# Patient Record
Sex: Male | Born: 1960 | Race: Black or African American | Hispanic: No | Marital: Married | Smoking: Current every day smoker
Health system: Southern US, Community
[De-identification: ages and names within clinical notes are randomized; demographics above are authoritative.]

## PROBLEM LIST (undated history)

## (undated) ENCOUNTER — Emergency Department: Payer: Self-pay | Source: Home / Self Care

## (undated) DIAGNOSIS — B1920 Unspecified viral hepatitis C without hepatic coma: Secondary | ICD-10-CM

## (undated) HISTORY — PX: FEMORAL TRACTION PIN: SHX1589

## (undated) HISTORY — PX: HERNIA REPAIR: SHX51

---

## 2007-06-14 ENCOUNTER — Emergency Department (HOSPITAL_COMMUNITY): Admission: EM | Admit: 2007-06-14 | Discharge: 2007-06-14 | Payer: Self-pay | Admitting: Emergency Medicine

## 2007-10-01 ENCOUNTER — Emergency Department (HOSPITAL_COMMUNITY): Admission: EM | Admit: 2007-10-01 | Discharge: 2007-10-01 | Payer: Self-pay | Admitting: Emergency Medicine

## 2008-03-24 ENCOUNTER — Encounter (INDEPENDENT_AMBULATORY_CARE_PROVIDER_SITE_OTHER): Payer: Self-pay | Admitting: Emergency Medicine

## 2008-03-24 ENCOUNTER — Ambulatory Visit: Payer: Self-pay | Admitting: Surgery

## 2008-03-24 ENCOUNTER — Emergency Department (HOSPITAL_COMMUNITY): Admission: EM | Admit: 2008-03-24 | Discharge: 2008-03-24 | Payer: Self-pay | Admitting: Emergency Medicine

## 2008-09-05 ENCOUNTER — Emergency Department (HOSPITAL_COMMUNITY): Admission: EM | Admit: 2008-09-05 | Discharge: 2008-09-05 | Payer: Self-pay | Admitting: Emergency Medicine

## 2008-09-14 ENCOUNTER — Ambulatory Visit (HOSPITAL_COMMUNITY): Admission: RE | Admit: 2008-09-14 | Discharge: 2008-09-14 | Payer: Self-pay | Admitting: Chiropractic Medicine

## 2009-05-04 ENCOUNTER — Emergency Department (HOSPITAL_COMMUNITY): Admission: EM | Admit: 2009-05-04 | Discharge: 2009-05-04 | Payer: Self-pay | Admitting: Emergency Medicine

## 2009-06-07 ENCOUNTER — Ambulatory Visit (HOSPITAL_COMMUNITY): Admission: RE | Admit: 2009-06-07 | Discharge: 2009-06-08 | Payer: Self-pay | Admitting: General Surgery

## 2010-12-09 LAB — CBC
HCT: 51.1 % (ref 39.0–52.0)
MCV: 83.5 fL (ref 78.0–100.0)
Platelets: 231 10*3/uL (ref 150–400)
RBC: 6.12 MIL/uL — ABNORMAL HIGH (ref 4.22–5.81)
RDW: 14.9 % (ref 11.5–15.5)

## 2010-12-09 LAB — BASIC METABOLIC PANEL
BUN: 6 mg/dL (ref 6–23)
Calcium: 9.4 mg/dL (ref 8.4–10.5)
Chloride: 103 mEq/L (ref 96–112)
Creatinine, Ser: 0.88 mg/dL (ref 0.4–1.5)
GFR calc Af Amer: >60 mL/min (ref >60)
Glucose, Bld: 90 mg/dL (ref 70–99)
Sodium: 140 mEq/L (ref 135–145)

## 2010-12-09 LAB — DIFFERENTIAL
Eosinophils Absolute: 0.3 10*3/uL (ref 0.0–0.7)
Lymphocytes Relative: 45 % (ref 12–46)
Monocytes Absolute: 0.7 10*3/uL (ref 0.1–1.0)
Monocytes Relative: 10 % (ref 3–12)
Neutrophils Relative %: 41 % — ABNORMAL LOW (ref 43–77)

## 2010-12-10 LAB — BASIC METABOLIC PANEL
BUN: 6 mg/dL (ref 6–23)
GFR calc non Af Amer: >60 mL/min (ref >60)
Glucose, Bld: 110 mg/dL — ABNORMAL HIGH (ref 70–99)
Potassium: 4.6 mEq/L (ref 3.5–5.1)

## 2010-12-10 LAB — URINALYSIS, ROUTINE W REFLEX MICROSCOPIC
Bilirubin Urine: NEGATIVE
Hgb urine dipstick: NEGATIVE
Ketones, ur: 15 mg/dL — AB
Nitrite: NEGATIVE
Protein, ur: NEGATIVE mg/dL
pH: 7 (ref 5.0–8.0)

## 2010-12-10 LAB — CBC
HCT: 51.3 % (ref 39.0–52.0)
Hemoglobin: 17.1 g/dL — ABNORMAL HIGH (ref 13.0–17.0)
RDW: 15 % (ref 11.5–15.5)
WBC: 7.7 10*3/uL (ref 4.0–10.5)

## 2010-12-10 LAB — DIFFERENTIAL
Eosinophils Relative: 0 % (ref 0–5)
Monocytes Absolute: 0.4 10*3/uL (ref 0.1–1.0)
Monocytes Relative: 6 % (ref 3–12)

## 2011-06-02 LAB — DIFFERENTIAL
Lymphs Abs: 2.5
Monocytes Absolute: 0.8
Monocytes Relative: 15 — ABNORMAL HIGH

## 2011-06-02 LAB — PROTIME-INR
INR: 1.1
Prothrombin Time: 14.2

## 2011-06-02 LAB — COMPREHENSIVE METABOLIC PANEL
AST: 40 — ABNORMAL HIGH
Albumin: 3.7
Alkaline Phosphatase: 61
BUN: 7
Calcium: 9.2
Chloride: 105
Creatinine, Ser: 0.88
GFR calc Af Amer: >60
Glucose, Bld: 103 — ABNORMAL HIGH
Total Protein: 6.7

## 2011-06-02 LAB — CBC
HCT: 51
MCV: 83.1
RBC: 6.14 — ABNORMAL HIGH
RDW: 14.1

## 2011-06-02 LAB — D-DIMER, QUANTITATIVE: D-Dimer, Quant: <0.22

## 2011-06-15 LAB — I-STAT 8, (EC8 V) (CONVERTED LAB)
Bicarbonate: 28.4 — ABNORMAL HIGH
Glucose, Bld: 89
Potassium: 4.1
TCO2: 30
pCO2, Ven: 50.5 — ABNORMAL HIGH
pH, Ven: 7.357 — ABNORMAL HIGH

## 2011-06-15 LAB — DIFFERENTIAL
Basophils Absolute: 0.1
Basophils Relative: 1
Eosinophils Absolute: 0.2
Eosinophils Relative: 3
Lymphocytes Relative: 40
Lymphs Abs: 2.4
Monocytes Absolute: 0.9 — ABNORMAL HIGH
Neutro Abs: 2.5
Neutrophils Relative %: 42 — ABNORMAL LOW

## 2011-06-15 LAB — APTT: aPTT: 33

## 2011-06-15 LAB — HEPATIC FUNCTION PANEL
ALT: 55 — ABNORMAL HIGH
Albumin: 3.8
Alkaline Phosphatase: 62
Total Bilirubin: 0.5
Total Protein: 7

## 2011-06-15 LAB — LIPASE, BLOOD: Lipase: 14

## 2011-06-15 LAB — PROTIME-INR
INR: 1
Prothrombin Time: 13.6

## 2011-06-15 LAB — CBC
HCT: 47.7
Platelets: 223

## 2011-08-25 ENCOUNTER — Emergency Department (HOSPITAL_COMMUNITY)
Admission: EM | Admit: 2011-08-25 | Discharge: 2011-08-27 | Discharge status: Against Medical Advice | Payer: Self-pay | Attending: Emergency Medicine | Admitting: Emergency Medicine

## 2011-08-25 ENCOUNTER — Encounter (HOSPITAL_COMMUNITY): Payer: Self-pay | Admitting: *Deleted

## 2011-08-25 ENCOUNTER — Emergency Department (INDEPENDENT_AMBULATORY_CARE_PROVIDER_SITE_OTHER)
Admission: EM | Admit: 2011-08-25 | Discharge: 2011-08-25 | Disposition: A | Discharge status: Nursing Home (Includes ICF) | Payer: Self-pay | Source: Home / Self Care

## 2011-08-25 DIAGNOSIS — B1920 Unspecified viral hepatitis C without hepatic coma: Secondary | ICD-10-CM

## 2011-08-25 DIAGNOSIS — R109 Unspecified abdominal pain: Secondary | ICD-10-CM

## 2011-08-25 DIAGNOSIS — Z0389 Encounter for observation for other suspected diseases and conditions ruled out: Secondary | ICD-10-CM | POA: Insufficient documentation

## 2011-08-25 HISTORY — DX: Unspecified viral hepatitis C without hepatic coma: B19.20

## 2011-08-25 MED ORDER — RABIES VACCINE, PCEC IM SUSR
INTRAMUSCULAR | Status: AC
  Filled 2011-08-25: qty 1

## 2011-08-25 NOTE — ED Notes (Signed)
Was reportedly dx as having hepatitis C in 06-2011, and then went on a drinking binge; c/o he has been feeling bloated, and having all kinds of abdominal area pain; denies recent trauma to abdominal area, reports regular BM's

## 2011-08-25 NOTE — ED Provider Notes (Signed)
History     CSN: [id number]  Arrival date & time 08/25/11  1237   None     Chief Complaint  Patient presents with  . Bloated    (Consider location/radiation/quality/duration/timing/severity/associated sxs/prior treatment) HPI Comments: Pt states he was dx with Hepatitis C Oct 2012 after donating plasma. He did not follow up with a physician. He states after being diagnosed he began drinking heavier. He states he was drinking a 24 pack of beer and 2-3 quarts of wine at least twice a week. He stopped drinking 1 1/2 weeks ago - denies withdrawal symptoms. He c/o abd bloating for 2 1/2 weeks and abd pain and cramping for 2 weeks. Having daily BM and is loose twice a week. This is a change for him as he previously was always constipated. No blood in stool.  Appetite is normal and denies nausea and vomiting.    Past Medical History  Diagnosis Date  . Hepatitis C     History reviewed. No pertinent past surgical history.  History reviewed. No pertinent family history.  History  Substance Use Topics  . Smoking status: Not on file  . Smokeless tobacco: Not on file  . Alcohol Use: Yes     binges      Review of Systems  Constitutional: Negative for fever and chills.  Respiratory: Negative for shortness of breath.   Cardiovascular: Negative for chest pain and leg swelling.  Gastrointestinal: Positive for abdominal pain and abdominal distention. Negative for nausea, vomiting, diarrhea, constipation and blood in stool.  Genitourinary: Positive for frequency. Negative for dysuria.    Allergies  Review of patient's allergies indicates no known allergies.  Home Medications  No current outpatient prescriptions on file.  BP 121/70  Pulse 72  Temp(Src) 98.5 F (36.9 C) (Oral)  Resp 16  SpO2 99%  Physical Exam  Nursing note and vitals reviewed. Constitutional: He appears well-developed and well-nourished. No distress.  HENT:  Head: Normocephalic and atraumatic.  Right Ear:  Tympanic membrane, external ear and ear canal normal.  Left Ear: Tympanic membrane, external ear and ear canal normal.  Nose: Nose normal.  Mouth/Throat: Uvula is midline, oropharynx is clear and moist and mucous membranes are normal. No oropharyngeal exudate, posterior oropharyngeal edema or posterior oropharyngeal erythema.  Neck: Neck supple.  Cardiovascular: Normal rate, regular rhythm and normal heart sounds.   Pulmonary/Chest: Effort normal and breath sounds normal. No respiratory distress.  Abdominal: Soft. Bowel sounds are normal. He exhibits distension. He exhibits no ascites and no mass. There is no hepatosplenomegaly. There is tenderness in the right upper quadrant. There is no rebound, no guarding and negative Murphy's sign.  Lymphadenopathy:    He has no cervical adenopathy.  Neurological: He is alert.  Skin: Skin is warm and dry.  Psychiatric: He has a normal mood and affect.    ED Course  Procedures (including critical care time)  Labs Reviewed - No data to display No results found.   1. Abdominal pain   2. Hepatitis C       MDM  Hep C, alcoholism, abd pain and bloating - transferred to [facility].        Melody Comas, [place] 08/25/11 [phone]

## 2011-08-25 NOTE — ED Notes (Signed)
Called x 1 in triage waiting and main waiting.

## 2011-08-25 NOTE — ED Provider Notes (Signed)
Medical screening examination/treatment/procedure(s) were performed by non-physician practitioner and as supervising physician I was immediately available for consultation/collaboration.  Raynald Blend, MD 08/25/11 [phone]

## 2011-08-25 NOTE — ED Notes (Signed)
To ed for eval of abd pain for the past 2 weeks. Pain is generalized. Hx of hernia repair

## 2011-09-05 ENCOUNTER — Emergency Department (HOSPITAL_COMMUNITY): Payer: Self-pay

## 2011-09-05 ENCOUNTER — Emergency Department (HOSPITAL_COMMUNITY)
Admission: EM | Admit: 2011-09-05 | Discharge: 2011-09-05 | Disposition: A | Discharge status: Home / Self Care | Payer: Self-pay | Attending: Emergency Medicine | Admitting: Emergency Medicine

## 2011-09-05 ENCOUNTER — Encounter (HOSPITAL_COMMUNITY): Payer: Self-pay | Admitting: *Deleted

## 2011-09-05 DIAGNOSIS — R10819 Abdominal tenderness, unspecified site: Secondary | ICD-10-CM | POA: Insufficient documentation

## 2011-09-05 DIAGNOSIS — Z9889 Other specified postprocedural states: Secondary | ICD-10-CM | POA: Insufficient documentation

## 2011-09-05 DIAGNOSIS — Z8619 Personal history of other infectious and parasitic diseases: Secondary | ICD-10-CM | POA: Insufficient documentation

## 2011-09-05 DIAGNOSIS — R109 Unspecified abdominal pain: Secondary | ICD-10-CM | POA: Insufficient documentation

## 2011-09-05 LAB — CBC
Hemoglobin: 14.6 g/dL (ref 13.0–17.0)
MCV: 82.6 fL (ref 78.0–100.0)
Platelets: 177 10*3/uL (ref 150–400)
RBC: 5.47 MIL/uL (ref 4.22–5.81)
WBC: 4.9 10*3/uL (ref 4.0–10.5)

## 2011-09-05 LAB — COMPREHENSIVE METABOLIC PANEL
ALT: 51 U/L (ref 0–53)
Alkaline Phosphatase: 70 U/L (ref 39–117)
CO2: 24 mEq/L (ref 19–32)
Calcium: 8.8 mg/dL (ref 8.4–10.5)
GFR calc Af Amer: >90 mL/min (ref >90)
GFR calc non Af Amer: >90 mL/min (ref >90)
Glucose, Bld: 85 mg/dL (ref 70–99)
Potassium: 4.2 mEq/L (ref 3.5–5.1)
Sodium: 140 mEq/L (ref 135–145)

## 2011-09-05 LAB — URINALYSIS, ROUTINE W REFLEX MICROSCOPIC
Bilirubin Urine: NEGATIVE
Hgb urine dipstick: NEGATIVE
Protein, ur: NEGATIVE mg/dL
Urobilinogen, UA: 1 mg/dL (ref 0.0–1.0)

## 2011-09-05 LAB — DIFFERENTIAL
Eosinophils Relative: 6 % — ABNORMAL HIGH (ref 0–5)
Lymphocytes Relative: 45 % (ref 12–46)
Lymphs Abs: 2.2 10*3/uL (ref 0.7–4.0)
Monocytes Relative: 15 % — ABNORMAL HIGH (ref 3–12)

## 2011-09-05 MED ORDER — IOHEXOL 300 MG/ML  SOLN
100.0000 mL | Freq: Once | INTRAMUSCULAR | Status: AC | PRN
  Administered 2011-09-05: 100 mL via INTRAVENOUS

## 2011-09-05 MED ORDER — SODIUM CHLORIDE 0.9 % IV BOLUS (SEPSIS)
1000.0000 mL | Freq: Once | INTRAVENOUS | Status: AC
  Administered 2011-09-05: 1000 mL via INTRAVENOUS

## 2011-09-05 MED ORDER — HYDROMORPHONE HCL PF 1 MG/ML IJ SOLN
1.0000 mg | Freq: Once | INTRAMUSCULAR | Status: AC
  Administered 2011-09-05: 1 mg via INTRAVENOUS
  Filled 2011-09-05: qty 1

## 2011-09-05 MED ORDER — SODIUM CHLORIDE 0.9 % IV SOLN
INTRAVENOUS | Status: DC
  Administered 2011-09-05: 11:00:00 via INTRAVENOUS

## 2011-09-05 MED ORDER — ONDANSETRON 8 MG PO TBDP: ORAL_TABLET | ORAL | Status: AC

## 2011-09-05 MED ORDER — HYDROCODONE-ACETAMINOPHEN 5-325 MG PO TABS: 2.0000 | ORAL_TABLET | ORAL | Status: AC | PRN

## 2011-09-05 NOTE — ED Provider Notes (Signed)
History     CSN: [id number]  Arrival date & time 09/05/11  1191   First MD Initiated Contact with Patient 09/05/11 [phone]      Chief Complaint  Patient presents with  . Abdominal Pain    (Consider location/radiation/quality/duration/timing/severity/associated sxs/prior treatment) HPI This 51 year old male has about 3 weeks of constant diffuse abdominal pain mostly to the lateral quadrant with intermittent mild nausea but no vomiting no diarrhea no bloody stools no dysuria no scrotal pain or testicular pain no chest pain cough shortness of breath and no exertional pain. His pain is worse with position changes and palpation are better if he stays still. His pain is nonradiating and without associated symptoms or treatment prior to arrival. In the fall of 2012 he was told he had hepatitis C when he was trying to donate plasma. He had a ventral hernia repair in 2010. Past Medical History  Diagnosis Date  . Hepatitis C     Past Surgical History  Procedure Date  . Hernia repair     No family history on file.  History  Substance Use Topics  . Smoking status: Not on file  . Smokeless tobacco: Not on file  . Alcohol Use: Yes     binges      Review of Systems  Constitutional: Negative for fever.       10 Systems reviewed and are negative for acute change except as noted in the HPI.  HENT: Negative for congestion.   Eyes: Negative for discharge and redness.  Respiratory: Negative for cough and shortness of breath.   Cardiovascular: Negative for chest pain.  Gastrointestinal: Positive for nausea and abdominal pain. Negative for vomiting.  Musculoskeletal: Negative for back pain.  Skin: Negative for rash.  Neurological: Negative for syncope, numbness and headaches.  Psychiatric/Behavioral:       No behavior change.    Allergies  Review of patient's allergies indicates no known allergies.  Home Medications   Current Outpatient Rx  Name Route Sig Dispense Refill  .  CITALOPRAM HYDROBROMIDE 20 MG PO TABS Oral Take 20 mg by mouth daily.      . TRAZODONE HCL 100 MG PO TABS Oral Take 50-100 mg by mouth at bedtime.      Marland Kitchen HYDROCODONE-ACETAMINOPHEN 5-325 MG PO TABS Oral Take 2 tablets by mouth every 4 (four) hours as needed for pain. 20 tablet 0  . ONDANSETRON 8 MG PO TBDP  8mg  ODT q4 hours prn nausea 4 tablet 0    BP 134/91  Pulse 64  Temp(Src) 97.8 F (36.6 C) (Oral)  Resp 14  SpO2 99%  Physical Exam  Nursing note and vitals reviewed. Constitutional:       Awake, alert, nontoxic appearance.  HENT:  Head: Atraumatic.  Eyes: Right eye exhibits no discharge. Left eye exhibits no discharge.  Neck: Neck supple.  Cardiovascular: Normal rate and regular rhythm.   No murmur heard. Pulmonary/Chest: Effort normal and breath sounds normal. No respiratory distress. He has no wheezes. He has no rales. He exhibits no tenderness.  Abdominal: Soft. Bowel sounds are normal. He exhibits no mass. There is tenderness. There is guarding. There is no rebound.       Mild diffuse tenderness with moderate tenderness without rebound to the left lower quadrant  Genitourinary:       Testicles are nontender and no palpable inguinal hernias  Musculoskeletal: He exhibits no edema and no tenderness.       Baseline ROM, no obvious new focal weakness.  Neurological: He is alert.       Mental status and motor strength appears baseline for patient and situation.  Skin: No rash noted.  Psychiatric: He has a normal mood and affect.    ED Course  Procedures (including critical care time)  Labs Reviewed  DIFFERENTIAL - Abnormal; Notable for the following:    Neutrophils Relative 33 (*)    Neutro Abs 1.6 (*)    Monocytes Relative 15 (*)    Eosinophils Relative 6 (*)    All other components within normal limits  COMPREHENSIVE METABOLIC PANEL - Abnormal; Notable for the following:    Total Bilirubin 0.2 (*)    All other components within normal limits  CBC  LIPASE, BLOOD    URINALYSIS, ROUTINE W REFLEX MICROSCOPIC   Ct Abdomen Pelvis W Contrast  09/05/2011  *RADIOLOGY REPORT*  Clinical Data: Chronic abdominal pain with increased mid and left lower quadrant pain today.  Nausea.  CT ABDOMEN AND PELVIS WITH CONTRAST  Technique:  Multidetector CT imaging of the abdomen and pelvis was performed following the standard protocol during bolus administration of intravenous contrast.  Contrast: OMNIPAQUE IOHEXOL 300 MG/ML IV SOLN  Comparison: Prior examination 05/04/2009.  Findings: There is mild linear scarring or atelectasis in the lingula.  The lung bases are otherwise clear.  There is no pleural effusion.  There are postsurgical changes within the anterior abdominal wall status post ventral hernia repair.  No recurrent hernia is demonstrated.  The bowel gas pattern is normal.  There is stool throughout the colon.  The appendix appears normal.  No inflammatory changes are demonstrated.  The liver, gallbladder, biliary system and pancreas appear stable. There are stable small splenic granulomas.  The adrenal glands and kidneys appear unremarkable.  There are numerous prominent lymph nodes within the porta hepatis which are similar to the prior study.  Based on stability, these are likely reactive.  There is no pelvic adenopathy.  The bladder, prostate gland and seminal vesicles appear normal.  There is a convex right lumbar scoliosis with associated tortuosity of the abdominal aorta.  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  No acute osseous findings are demonstrated. Spondylosis contributes to some foraminal stenosis, especially on the right at L5-S1.  IMPRESSION:  1.  Interval ventral hernia repair.  No recurrent hernia demonstrated. 2.  No acute abdominal pelvic findings seen. 3.  Stable prominent lymph nodes in the porta hepatis, likely reactive. 4.  Stable lumbar scoliosis and hemivertebra just above the  lumbosacral junction.  Original Report Authenticated By: Gerrianne Scale, M.D.     1. Abdominal pain       MDM  Pt stable in ED with no significant deterioration in condition.Patient / Family / Caregiver informed of clinical course, understand medical decision-making process, and agree with plan.       Hurman Horn, MD 09/05/11 [phone]

## 2011-09-05 NOTE — ED Notes (Signed)
MD at bedside. 

## 2011-09-05 NOTE — ED Notes (Signed)
Pt with chronic abd pain, mid abd pain today, nausea with symptoms, regular bowel movement, no urinary symptoms

## 2011-09-05 NOTE — ED Notes (Signed)
Pt in radiology 

## 2013-10-26 IMAGING — CT CT ABD-PELV W/ CM
1 of 3 series · 14 of 32 positions shown, 19 images · IV contrast (APPLIED)
Comparison: Prior examination 05/04/2009.

CLINICAL DATA: Chronic abdominal pain with increased mid and left
lower quadrant pain today.  Nausea.

CT ABDOMEN AND PELVIS WITH CONTRAST
TECHNIQUE: Multidetector CT imaging of the abdomen and pelvis was
performed following the standard protocol during bolus
administration of intravenous contrast.
Contrast: 100mL OMNIPAQUE IOHEXOL 300 MG/ML IV SOLN

[Series 2: abd/pel with · axial · 0.84mm/px · z∈[+1231,+1631]mm · 14 of 90 slices shown, 19 images]
[im 5/90  soft-tissue]
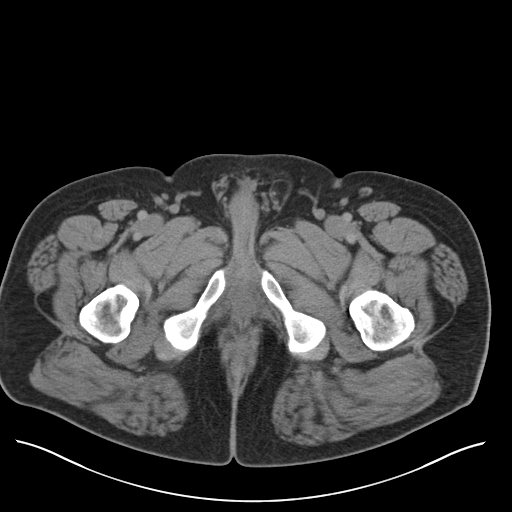
[im 5/90  bone]
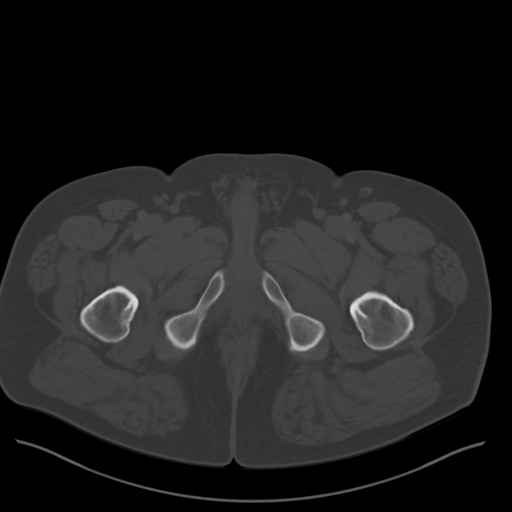
[im 14/90  soft-tissue]
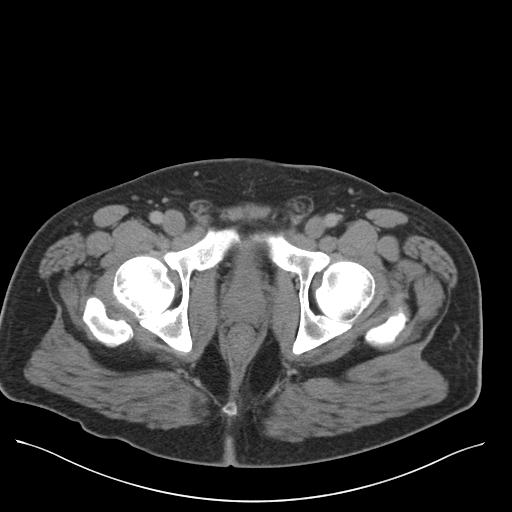
[im 18/90  soft-tissue]
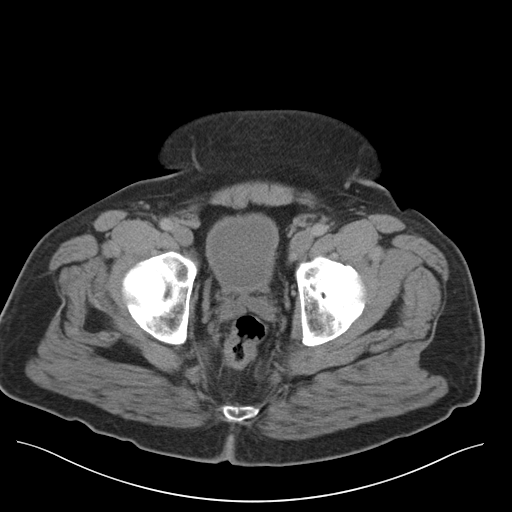
[im 27/90  soft-tissue]
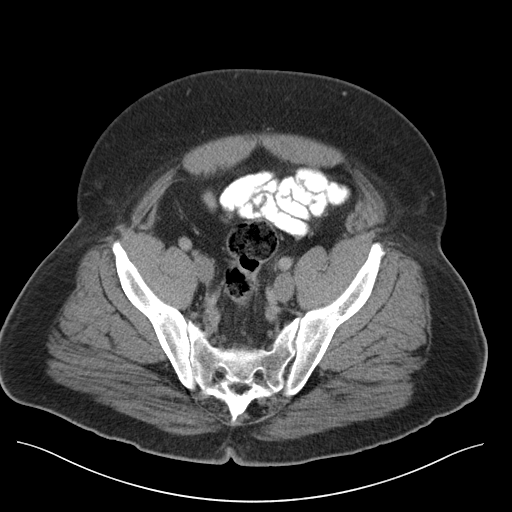
[im 32/90  soft-tissue]
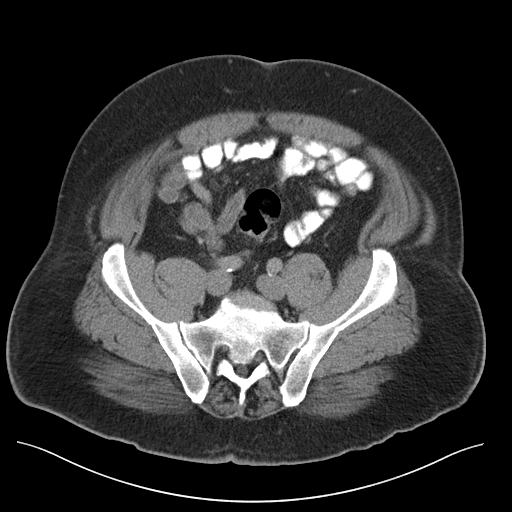
[im 41/90  soft-tissue]
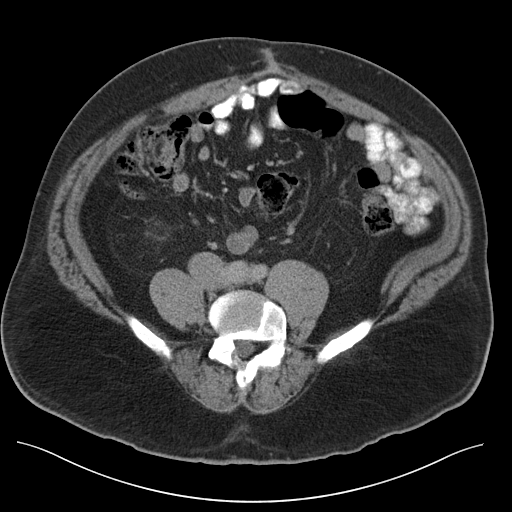
[im 45/90  soft-tissue]
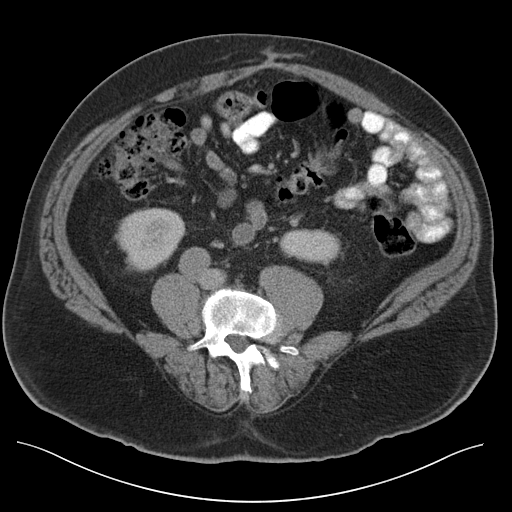
[im 49/90  soft-tissue]
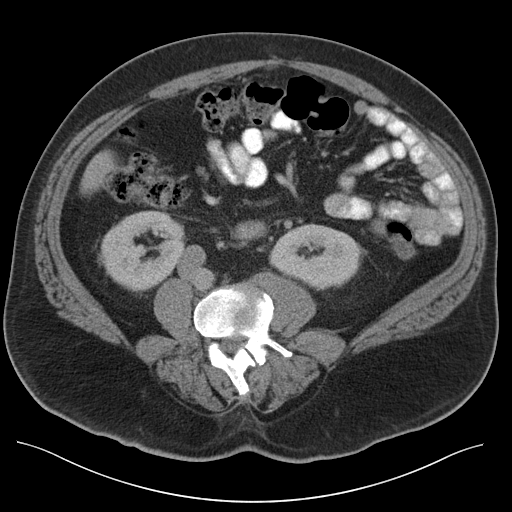
[im 58/90  soft-tissue]
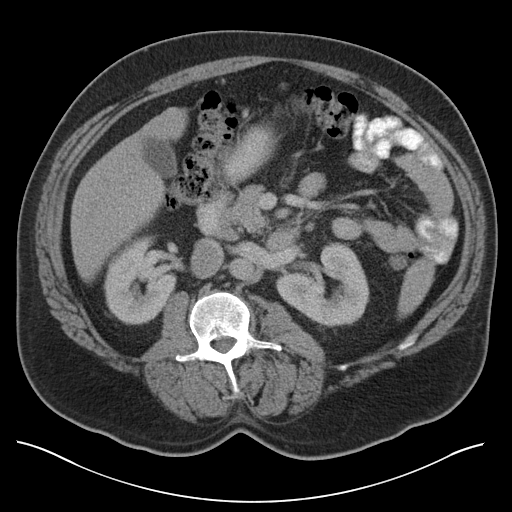
[im 58/90  bone]
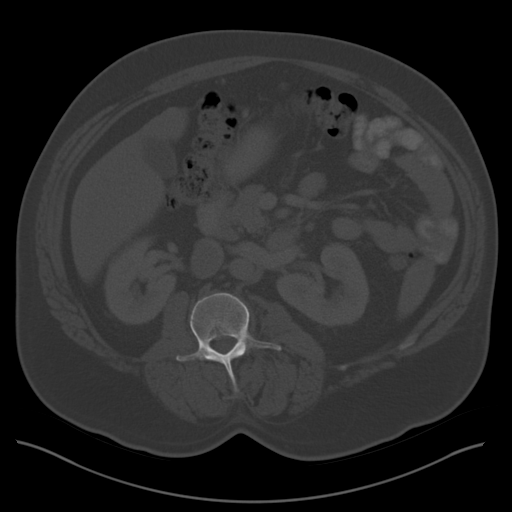
[im 63/90  soft-tissue]
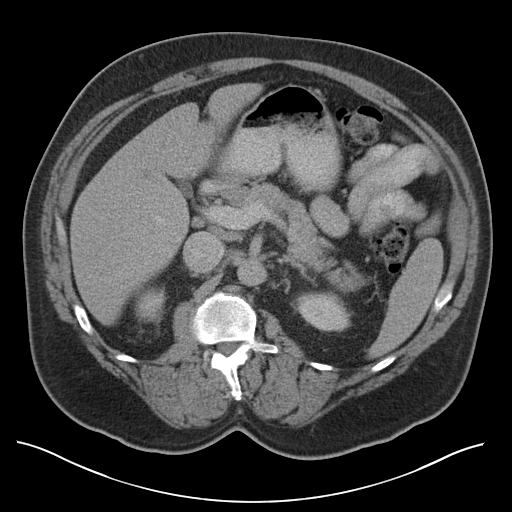
[im 72/90  soft-tissue]
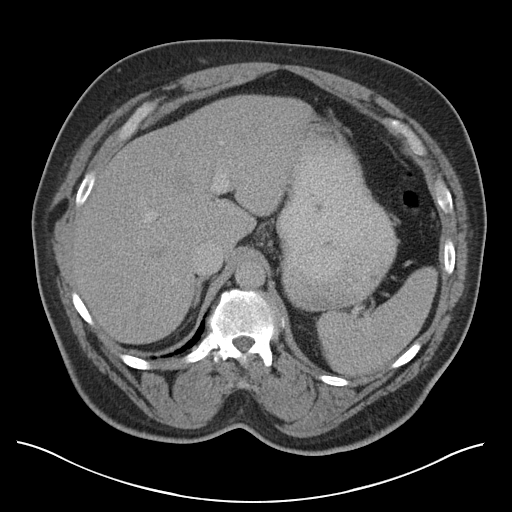
[im 72/90  lung]
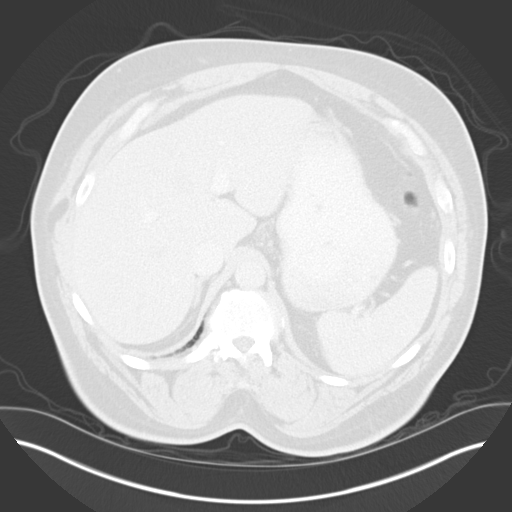
[im 76/90  soft-tissue]
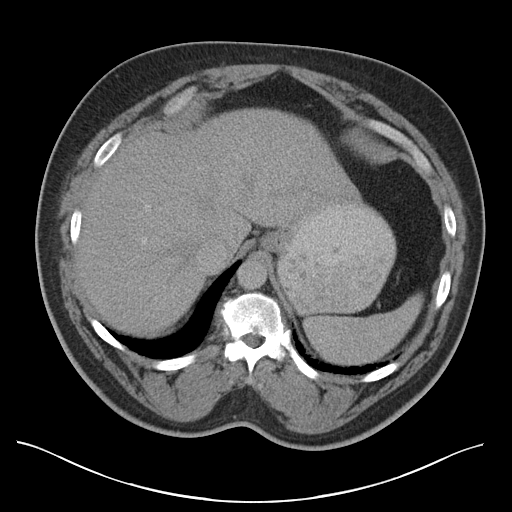
[im 76/90  lung]
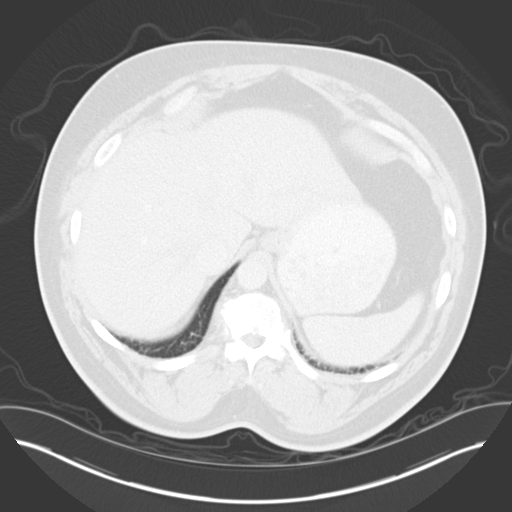
[im 81/90  lung]
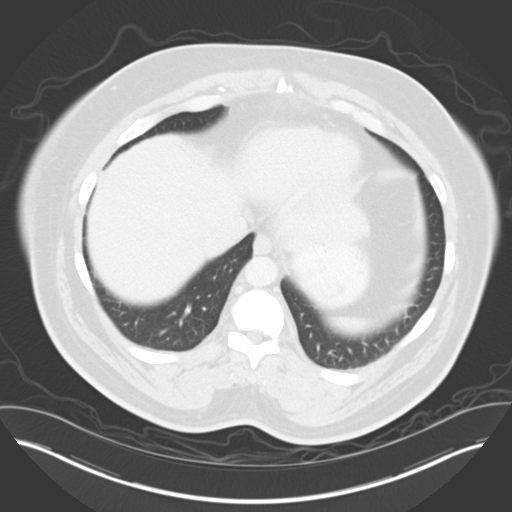
[im 85/90  soft-tissue]
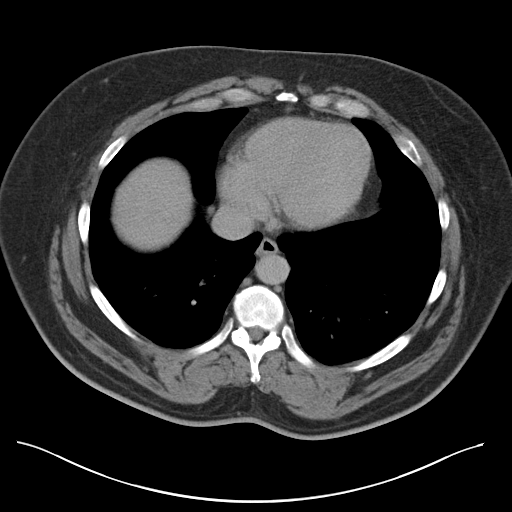
[im 85/90  lung]
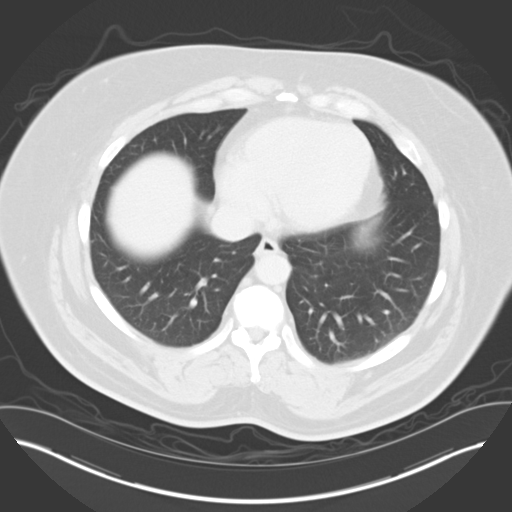

[14 of 32 positions shown; findings below may reference images not displayed]

FINDINGS: There is mild linear scarring or atelectasis in the
lingula.  The lung bases are otherwise clear.  There is no pleural
effusion.

There are postsurgical changes within the anterior abdominal wall
status post ventral hernia repair.  No recurrent hernia is
demonstrated.  The bowel gas pattern is normal.  There is stool
throughout the colon.  The appendix appears normal.  No
inflammatory changes are demonstrated.

The liver, gallbladder, biliary system and pancreas appear stable.
There are stable small splenic granulomas.  The adrenal glands and
kidneys appear unremarkable.

There are numerous prominent lymph nodes within the porta hepatis
which are similar to the prior study.  Based on stability, these
are likely reactive.  There is no pelvic adenopathy.  The bladder,
prostate gland and seminal vesicles appear normal.

There is a convex right lumbar scoliosis with associated tortuosity
of the abdominal aorta.  There is a congenital deformity in the
lower lumbar spine with a hemivertebra just above the lumbosacral
junction.  No acute osseous findings are demonstrated. Spondylosis
contributes to some foraminal stenosis, especially on the right at
L5-S1.
IMPRESSION: 1.  Interval ventral hernia repair.  No recurrent hernia
demonstrated.
2.  No acute abdominal pelvic findings seen.
3.  Stable prominent lymph nodes in the porta hepatis, likely
reactive.
4.  Stable lumbar scoliosis and hemivertebra just above the
lumbosacral junction.

## 2020-01-22 ENCOUNTER — Emergency Department (HOSPITAL_COMMUNITY)
Admission: EM | Admit: 2020-01-22 | Discharge: 2020-01-22 | Disposition: A | Discharge status: Home / Self Care | Payer: Self-pay | Attending: Emergency Medicine | Admitting: Emergency Medicine

## 2020-01-22 ENCOUNTER — Encounter (HOSPITAL_COMMUNITY): Payer: Self-pay | Admitting: *Deleted

## 2020-01-22 DIAGNOSIS — S93401A Sprain of unspecified ligament of right ankle, initial encounter: Secondary | ICD-10-CM | POA: Insufficient documentation

## 2020-01-22 DIAGNOSIS — Y92007 Garden or yard of unspecified non-institutional (private) residence as the place of occurrence of the external cause: Secondary | ICD-10-CM | POA: Insufficient documentation

## 2020-01-22 DIAGNOSIS — Y93H2 Activity, gardening and landscaping: Secondary | ICD-10-CM | POA: Insufficient documentation

## 2020-01-22 DIAGNOSIS — W1842XA Slipping, tripping and stumbling without falling due to stepping into hole or opening, initial encounter: Secondary | ICD-10-CM | POA: Insufficient documentation

## 2020-01-22 DIAGNOSIS — Y999 Unspecified external cause status: Secondary | ICD-10-CM | POA: Insufficient documentation

## 2020-01-22 NOTE — ED Provider Notes (Signed)
[facility] EMERGENCY DEPARTMENT Provider Note   CSN: [id number] Arrival date & time: 01/22/20  0347     History Chief Complaint  Patient presents with  . work note    Ryan Carlson is a 59 y.o. male who presents for a work note. He states "I don't even need to be here". He states that he was cutting grass a couple days ago and stepped in a hole with his R foot and twisted his ankle. It became swollen and so he rested and iced it. 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 He states he has been walking normally. He denies any pain or swelling.   HPI     Past Medical History:  Diagnosis Date  . Hepatitis C     There are no problems to display for this patient.   Past Surgical History:  Procedure Laterality Date  . HERNIA REPAIR         No family history on file.  Social History   Tobacco Use  . Smoking status: Not on file  Substance Use Topics  . Alcohol use: Yes    Comment: binges  . Drug use: Not on file    Home Medications Prior to Admission medications   Medication Sig Start Date End Date Taking? Authorizing Provider  citalopram (CELEXA) 20 MG tablet Take 20 mg by mouth daily.      [provider]  traZODone (DESYREL) 100 MG tablet Take 50-100 mg by mouth at bedtime.      [provider]    Allergies    Patient has no known allergies.  Review of Systems   Review of Systems  Musculoskeletal: Positive for arthralgias (resolved) and joint swelling (resolved).  Neurological: Negative for weakness and numbness.    Physical Exam Updated Vital Signs BP (!) 158/89 (BP Location: Right Arm)   Pulse 84   Temp 97.9 F (36.6 C) (Oral)   Resp 16   SpO2 98%   Physical Exam Vitals and nursing note reviewed.  Constitutional:      General: He is not in acute distress.  Appearance: He is well-developed. He is obese. He is not ill-appearing.  HENT:     Head: Normocephalic and atraumatic.  Eyes:     General: No scleral icterus.       Right eye: No discharge.        Left eye: No discharge.     Conjunctiva/sclera: Conjunctivae normal.     Pupils: Pupils are equal, round, and reactive to light.  Cardiovascular:     Rate and Rhythm: Normal rate.  Pulmonary:     Effort: Pulmonary effort is normal. No respiratory distress.  Abdominal:     General: There is no distension.  Musculoskeletal:     Cervical back: Normal range of motion.     Comments: Right ankle: No obvious swelling, deformity, or warmth. No tenderness. FROM. 5/5 strength. Cap refill <2. N/V intact.   Skin:    General: Skin is warm and dry.  Neurological:     Mental Status: He is alert and oriented to person, place, and time.  Psychiatric:        Behavior: Behavior normal.     ED Results / Procedures / Treatments   Labs (all labs ordered are listed, but only abnormal results  are displayed) Labs Reviewed - No data to display  EKG None  Radiology No results found.  Procedures Procedures (including critical care time)  Medications Ordered in ED Medications - No data to display  ED Course  I have reviewed the triage vital signs and the nursing notes.  Pertinent labs & imaging results that were available during my care of the patient were reviewed by me and considered in my medical decision making (see chart for details).  59 year old male presents for a work note after twisting his ankle a couple days ago.  Patient denies any symptoms and states that all the pain and swelling in his ankle is gone.  Imaging deferred today. Work note given.  MDM Rules/Calculators/A&P                      Final Clinical Impression(s) / ED Diagnoses Final diagnoses:  Sprain of right ankle, unspecified ligament, initial encounter    Rx / DC Orders ED Discharge Orders    None       Recardo Evangelist, PA-C 01/22/20 [place]    Charlesetta Shanks, MD 01/31/20 [phone]

## 2020-01-22 NOTE — ED Notes (Signed)
Patient Alert and oriented to baseline. Stable and ambulatory to baseline. Patient verbalized understanding of the discharge instructions.  Patient belongings were taken by the patient.   

## 2020-01-22 NOTE — ED Notes (Signed)
Pt moving arm during first BP documented. Rechecked

## 2020-01-22 NOTE — ED Triage Notes (Signed)
Sent to ED for a work note. States last week he was cutting his own grass (not at work) and stepped in a hole. Had ankle pain that evening - relieved with ice and no pain since. Pts employer wants him to have a work note stating his ankle is ok. Pt has zero complaints.

## 2023-08-07 ENCOUNTER — Other Ambulatory Visit: Payer: Self-pay | Admitting: Nurse Practitioner

## 2023-08-07 DIAGNOSIS — B182 Chronic viral hepatitis C: Secondary | ICD-10-CM

## 2023-08-07 DIAGNOSIS — K7400 Hepatic fibrosis, unspecified: Secondary | ICD-10-CM

## 2023-08-14 ENCOUNTER — Ambulatory Visit
Admission: RE | Admit: 2023-08-14 | Discharge: 2023-08-14 | Disposition: A | Discharge status: Home / Self Care | Payer: Self-pay | Source: Ambulatory Visit | Attending: Nurse Practitioner | Admitting: Nurse Practitioner

## 2023-08-14 DIAGNOSIS — B182 Chronic viral hepatitis C: Secondary | ICD-10-CM

## 2023-08-14 DIAGNOSIS — K7400 Hepatic fibrosis, unspecified: Secondary | ICD-10-CM

## 2023-08-31 ENCOUNTER — Ambulatory Visit: Payer: No Typology Code available for payment source | Admitting: Orthopaedic Surgery

## 2023-09-07 ENCOUNTER — Other Ambulatory Visit (INDEPENDENT_AMBULATORY_CARE_PROVIDER_SITE_OTHER): Payer: Self-pay

## 2023-09-07 ENCOUNTER — Encounter: Payer: Self-pay | Admitting: Physician Assistant

## 2023-09-07 ENCOUNTER — Ambulatory Visit (INDEPENDENT_AMBULATORY_CARE_PROVIDER_SITE_OTHER): Payer: No Typology Code available for payment source | Admitting: Physician Assistant

## 2023-09-07 ENCOUNTER — Other Ambulatory Visit: Payer: Self-pay

## 2023-09-07 DIAGNOSIS — M170 Bilateral primary osteoarthritis of knee: Secondary | ICD-10-CM | POA: Diagnosis not present

## 2023-09-07 MED ORDER — BUPIVACAINE HCL 0.25 % IJ SOLN
0.6600 mL | INTRAMUSCULAR | Status: AC | PRN
  Administered 2023-09-07: .66 mL via INTRA_ARTICULAR

## 2023-09-07 MED ORDER — LIDOCAINE HCL 1 % IJ SOLN
3.0000 mL | INTRAMUSCULAR | Status: AC | PRN
  Administered 2023-09-07: 3 mL

## 2023-09-07 MED ORDER — METHYLPREDNISOLONE ACETATE 40 MG/ML IJ SUSP
13.3300 mg | INTRAMUSCULAR | Status: AC | PRN
  Administered 2023-09-07: 13.33 mg via INTRA_ARTICULAR

## 2023-09-07 NOTE — Progress Notes (Signed)
   Office Visit Note   Patient: Ryan Carlson           Date of Birth: 27-Feb-1961           MRN: [id number] Visit Date: 09/07/2023              Requested by: Jhon Elveria LABOR, MD [address] [place],  [place] [address] PCP: Jhon Elveria LABOR, MD   Assessment & Plan: Visit Diagnoses:  1. Bilateral primary osteoarthritis of knee     Plan: Impression is bilateral knee osteoarthritis.  Today we discussed various treatment options to include intra-articular cortisone injection for which she would like to proceed.  He will follow-up as needed.  Follow-Up Instructions: Return if symptoms worsen or fail to improve.   Orders:  Orders Placed This Encounter  Procedures   Large Joint Inj: bilateral knee   XR KNEE 3 VIEW LEFT   XR KNEE 3 VIEW RIGHT   No orders of the defined types were placed in this encounter.     Procedures: Large Joint Inj: bilateral knee on 09/07/2023 9:07 AM Indications: pain Details: 22 G needle, anterolateral approach Medications (Right): 0.66 mL bupivacaine  0.25 %; 3 mL lidocaine  1 %; 13.33 mg methylPREDNISolone  acetate 40 MG/ML Medications (Left): 0.66 mL bupivacaine  0.25 %; 3 mL lidocaine  1 %; 13.33 mg methylPREDNISolone  acetate 40 MG/ML      Clinical Data: No additional findings.   Subjective: Chief Complaint  Patient presents with   Right Knee - Pain   Left Knee - Pain    HPI patient is a pleasant 63 year old gentleman who comes in today with bilateral knee pain both equally as bad.  Symptoms have been ongoing for the past year and a half.  Pain is constant but worse in the morning when he gets out of the bed.  He has a fair amount of stiffness at this time.  He has occasional swelling.  He has been using a knee brace which does seem to provide some support.  He does not take any medication for pain.  Review of Systems as detailed in HPI.  All others reviewed and are negative.   Objective: Vital Signs: There were no vitals  taken for this visit.  Physical Exam well-developed well-nourished gentleman in no acute distress.  Alert and oriented x 3.  Ortho Exam bilateral knee exam: Range of motion 0 to 115 degrees.  Medial joint line tenderness.  Mild patellofemoral crepitus.  He is neurovascular intact distally.  Specialty Comments:  No specialty comments available.  Imaging: XR KNEE 3 VIEW LEFT Result Date: 09/07/2023 Advanced degenerative changes medial and patellofemoral compartments  XR KNEE 3 VIEW RIGHT Result Date: 09/07/2023 X-rays demonstrate advanced degenerative changes to the medial and patellofemoral compartments    PMFS History: There are no active problems to display for this patient.  Past Medical History:  Diagnosis Date   Hepatitis C     No family history on file.  Past Surgical History:  Procedure Laterality Date   HERNIA REPAIR     Social History   Occupational History   Not on file  Tobacco Use   Smoking status: Not on file   Smokeless tobacco: Not on file  Substance and Sexual Activity   Alcohol use: Yes    Comment: binges   Drug use: Not on file   Sexual activity: Not on file

## 2023-10-30 LAB — COLOGUARD: COLOGUARD: NEGATIVE

## 2024-02-28 ENCOUNTER — Emergency Department (HOSPITAL_COMMUNITY)
Admission: EM | Admit: 2024-02-28 | Discharge: 2024-02-28 | Disposition: A | Discharge status: Home / Self Care | Attending: Emergency Medicine | Admitting: Emergency Medicine

## 2024-02-28 ENCOUNTER — Encounter (HOSPITAL_COMMUNITY): Payer: Self-pay

## 2024-02-28 ENCOUNTER — Other Ambulatory Visit: Payer: Self-pay

## 2024-02-28 DIAGNOSIS — R799 Abnormal finding of blood chemistry, unspecified: Secondary | ICD-10-CM | POA: Diagnosis present

## 2024-02-28 DIAGNOSIS — C9110 Chronic lymphocytic leukemia of B-cell type not having achieved remission: Secondary | ICD-10-CM | POA: Diagnosis not present

## 2024-02-28 LAB — CBC WITH DIFFERENTIAL/PLATELET
Abs Immature Granulocytes: 0 10*3/uL (ref 0.00–0.07)
Basophils Absolute: 0 10*3/uL (ref 0.0–0.1)
Basophils Relative: 0 %
Eosinophils Absolute: 0 10*3/uL (ref 0.0–0.5)
Eosinophils Relative: 0 %
HCT: 51.1 % (ref 39.0–52.0)
Hemoglobin: 16.5 g/dL (ref 13.0–17.0)
Lymphocytes Relative: 86 %
Lymphs Abs: 53.1 10*3/uL — ABNORMAL HIGH (ref 0.7–4.0)
MCH: 27.4 pg (ref 26.0–34.0)
MCHC: 32.3 g/dL (ref 30.0–36.0)
MCV: 84.9 fL (ref 80.0–100.0)
Monocytes Absolute: 1.2 10*3/uL — ABNORMAL HIGH (ref 0.1–1.0)
Monocytes Relative: 2 %
Neutro Abs: 7.4 10*3/uL (ref 1.7–7.7)
Neutrophils Relative %: 12 %
Platelets: 161 10*3/uL (ref 150–400)
RBC: 6.02 MIL/uL — ABNORMAL HIGH (ref 4.22–5.81)
RDW: 20.3 % — ABNORMAL HIGH (ref 11.5–15.5)
WBC: 61.7 10*3/uL (ref 4.0–10.5)
nRBC: 0 /100{WBCs}
nRBC: 0.7 % — ABNORMAL HIGH (ref 0.0–0.2)

## 2024-02-28 LAB — COMPREHENSIVE METABOLIC PANEL WITH GFR
ALT: 16 U/L (ref 0–44)
AST: 32 U/L (ref 15–41)
Albumin: 3.6 g/dL (ref 3.5–5.0)
Alkaline Phosphatase: 59 U/L (ref 38–126)
Anion gap: 7 (ref 5–15)
BUN: 12 mg/dL (ref 8–23)
CO2: 25 mmol/L (ref 22–32)
Calcium: 9 mg/dL (ref 8.9–10.3)
Chloride: 104 mmol/L (ref 98–111)
Creatinine, Ser: 0.98 mg/dL (ref 0.61–1.24)
GFR, Estimated: >60 mL/min (ref >60)
Glucose, Bld: 122 mg/dL — ABNORMAL HIGH (ref 70–99)
Potassium: 5 mmol/L (ref 3.5–5.1)
Sodium: 136 mmol/L (ref 135–145)
Total Bilirubin: 0.5 mg/dL (ref 0.0–1.2)
Total Protein: 7.3 g/dL (ref 6.5–8.1)

## 2024-02-28 LAB — I-STAT CG4 LACTIC ACID, ED: Lactic Acid, Venous: 1.2 mmol/L (ref 0.5–1.9)

## 2024-02-28 NOTE — ED Provider Notes (Signed)
 [place] EMERGENCY DEPARTMENT AT [facility] Provider Note   CSN: [id number] Arrival date & time: 02/28/24  1003     Patient presents with: abnormal labs   Ryan Carlson is a 63 y.o. male.   HPI 63 year old male presents with an elevated WBC.  He has a history of hepatitis C and was getting labs done yesterday in preparation for a visit today.  He was called this morning and told that his white blood cell count was in the 60s and to come to the ER for evaluation.  He denies fevers, weight loss, night sweats.  No vomiting, cough, or any other acute symptoms.  Prior to Admission medications   Not on File    Allergies: Patient has no known allergies.    Review of Systems  Constitutional:  Negative for diaphoresis, fever and unexpected weight change.  Respiratory:  Negative for cough.   Gastrointestinal:  Negative for abdominal pain and vomiting.    Updated Vital Signs BP (!) 160/99 (BP Location: Right Arm)   Pulse 99   Temp 98 F (36.7 C)   Resp 20   Ht 5' 7 (1.702 m)   Wt 127 kg   SpO2 92%   BMI 43.85 kg/m   Physical Exam Vitals and nursing note reviewed.  Constitutional:      Appearance: He is well-developed. He is obese.  HENT:     Head: Normocephalic and atraumatic.   Cardiovascular:     Rate and Rhythm: Normal rate and regular rhythm.     Heart sounds: Normal heart sounds.  Pulmonary:     Effort: Pulmonary effort is normal.     Breath sounds: Normal breath sounds.  Abdominal:     Palpations: Abdomen is soft.     Tenderness: There is no abdominal tenderness.   Skin:    General: Skin is warm and dry.   Neurological:     Mental Status: He is alert.     (all labs ordered are listed, but only abnormal results are displayed) Labs Reviewed  COMPREHENSIVE METABOLIC PANEL WITH GFR - Abnormal; Notable for the following components:      Result Value   Glucose, Bld 122 (*)    All other components within normal limits  CBC WITH  DIFFERENTIAL/PLATELET - Abnormal; Notable for the following components:   WBC 61.7 (*)    RBC 6.02 (*)    RDW 20.3 (*)    nRBC 0.7 (*)    Lymphs Abs 53.1 (*)    Monocytes Absolute 1.2 (*)    All other components within normal limits  URINALYSIS, W/ REFLEX TO CULTURE (INFECTION SUSPECTED)  PATHOLOGIST SMEAR REVIEW  FLOW CYTOMETRY  LACTATE DEHYDROGENASE  I-STAT CG4 LACTIC ACID, ED    EKG: None  Radiology: No results found.   Procedures   Medications Ordered in the ED - No data to display                                  Medical Decision Making Amount and/or Complexity of Data Reviewed External Data Reviewed: labs and notes. Labs: ordered.    Details: WBC 62 with predominantly lymphocytes   Patient is well-appearing.  I discussed with Dr. 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  He asks for flow cytometry and LDH to be ordered but will follow these up as outpatient.  Otherwise, patient has no red flags on history or  exam.  Will discharge for outpatient workup and follow-up.     Final diagnoses:  CLL (chronic lymphocytic leukemia) [facility])    ED Discharge Orders          Ordered    Ambulatory referral to Hematology / Oncology       Comments: Your emergency department provider has referred you to see a hematology/oncology specialist. These are physicians who specialize in blood disorders and cancers, or findings concerning for cancer. You will receive a phone call from the [facility] to set up your appointment within 2 business days: [employer] operate Mon - Fri, 8:00 a.m. to 5:00 p.m.; closed for federally recognized holidays. Please be sure your phone is not set to block numbers during this time.   02/28/24 1334               Freddi Hamilton, MD 02/28/24 1339

## 2024-02-28 NOTE — ED Triage Notes (Signed)
 Pt states he was at the liver doctor and did blood work and states WBC was 62. Hx of hepatitis. Denies fevers/n/v/d.

## 2024-02-28 NOTE — Discharge Instructions (Signed)
 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  You are being referred to a hematology/oncology specialist for further workup and treatment.  If you develop fever, pain, or any other new/concerning symptoms then return to the ER for evaluation.

## 2024-02-29 LAB — PATHOLOGIST SMEAR REVIEW

## 2024-03-03 NOTE — Progress Notes (Signed)
 [facility] [phone]   [phone]  INITIAL CONSULT NOTE  Patient Care Team: Jhon Elveria LABOR, MD as PCP - General (Family Medicine)  CHIEF COMPLAINTS/PURPOSE OF CONSULTATION:  Lymphocytosis  HISTORY OF PRESENTING ILLNESS:  Ryan Carlson 63 y.o. male with medical history significant for hepatitis C s/p treatment with Epclusa completed in March 2025 who presents to the hematology clinic for evaluation of lymphocytosis. He is unaccompanied for this visit.   On review of the previous records, Ryan Carlson presents to his hepatology clinic on 02/27/2024 for follow up for management of chronic hepatitis C. Labs showed leukocytosis/lymphocytosis with WBC 62.9 and absolute lymphocyte count of 56.5. He was directed to the emergency room for further evaluation. Peripheral smear review was done on 02/28/2024 that showed lymphocytosis with smudge cells highly suspicious for chronic lymphocytic leukemia.   On exam today, Ryan Carlson reports his energy and appetite are unchanged.He  can complete all his ADLs on his own. He denies any weight loss. He denies nausea, vomiting or bowel habit changes. He does have some fullness in the L side of his abdomen with mild discomfort. He denies easy bruising or signs of bleeding. He denies fevers, chills,sweat,shortness of breath, chest pain, cough, headaches, dizziness or palpable lumps/bumps. He has no other complaints.   MEDICAL HISTORY:  Past Medical History:  Diagnosis Date   Hepatitis C     SURGICAL HISTORY: Past Surgical History:  Procedure Laterality Date   FEMORAL TRACTION PIN     HERNIA REPAIR      SOCIAL HISTORY: Social History   Socioeconomic History   Marital status: Married    Spouse name: Not on file   Number of children: Not on file   Years of education: Not on file   Highest education level: Not on file  Occupational History   Not on file  Tobacco Use   Smoking status: Every Day    Current  packs/day: 1.00    Types: Cigarettes   Smokeless tobacco: Not on file  Substance and Sexual Activity   Alcohol use: Yes    Alcohol/week: 3.0 standard drinks of alcohol    Types: 3 Cans of beer per week   Drug use: Never   Sexual activity: Not on file  Other Topics Concern   Not on file  Social History Narrative   Not on file   Social Drivers of Health   Financial Resource Strain: Not on file  Food Insecurity: Low Risk  (03/06/2024)   Received from [facility]   Hunger Vital Sign    Within the past 12 months, you worried that your food would run out before you got money to buy more: Never true    Within the past 12 months, the food you bought just didn't last and you didn't have money to get more. : Never true  Recent Concern: Food Insecurity - Food Insecurity Present (03/04/2024)   Hunger Vital Sign    Worried About Running Out of Food in the Last Year: Sometimes true    Ran Out of Food in the Last Year: Sometimes true  Transportation Needs: No Transportation Needs (03/06/2024)   Received from [employer]    In the past 12 months, has lack of reliable transportation kept you from medical appointments, meetings, work or from getting things needed for daily living? : No  Physical Activity: Not on file  Stress: Not on file  Social Connections: Not on file  Intimate Partner Violence: Not  At Risk (03/04/2024)   Humiliation, Afraid, Rape, and Kick questionnaire    Fear of Current or Ex-Partner: No    Emotionally Abused: No    Physically Abused: No    Sexually Abused: No    FAMILY HISTORY: History reviewed. No pertinent family history.  ALLERGIES:  has no known allergies.  MEDICATIONS:  Current Outpatient Medications  Medication Sig Dispense Refill   losartan (COZAAR) 25 MG tablet Take 25 mg by mouth daily.     No current facility-administered medications for this visit.    REVIEW OF SYSTEMS:   Constitutional: ( - ) fevers, ( - )  chills , ( - ) night  sweats Eyes: ( - ) blurriness of vision, ( - ) double vision, ( - ) watery eyes Ears, nose, mouth, throat, and face: ( - ) mucositis, ( - ) sore throat Respiratory: ( - ) cough, ( - ) dyspnea, ( - ) wheezes Cardiovascular: ( - ) palpitation, ( - ) chest discomfort, ( - ) lower extremity swelling Gastrointestinal:  ( - ) nausea, ( - ) heartburn, ( - ) change in bowel habits Skin: ( - ) abnormal skin rashes Lymphatics: ( - ) new lymphadenopathy, ( - ) easy bruising Neurological: ( - ) numbness, ( - ) tingling, ( - ) new weaknesses Behavioral/Psych: ( - ) mood change, ( - ) new changes  All other systems were reviewed with the patient and are negative.  PHYSICAL EXAMINATION: ECOG PERFORMANCE STATUS: 0 - Asymptomatic  Vitals:   03/04/24 0822 03/04/24 0823  BP: (!) 145/83 130/74  Pulse: 76   Resp: 17   Temp: 97.6 F (36.4 C)   SpO2: 93%    Filed Weights   03/04/24 0822  Weight: 280 lb 12.8 oz (127.4 kg)    GENERAL: well appearing male in NAD  SKIN: skin color, texture, turgor are normal, no rashes or significant lesions EYES: conjunctiva are pink and non-injected, sclera clear OROPHARYNX: no exudate, no erythema; lips, buccal mucosa, and tongue normal  NECK: supple, non-tender LYMPH:  no palpable lymphadenopathy in the cervical, axillary or supraclavicular lymph nodes.  LUNGS: clear to auscultation and percussion with normal breathing effort HEART: regular rate & rhythm and no murmurs and no lower extremity edema ABDOMEN: soft, non-tender, normal bowel sounds. Fullness in left side of abdomen with palpable splenomegaly.  Musculoskeletal: no cyanosis of digits and no clubbing  PSYCH: alert & oriented x 3, fluent speech NEURO: no focal motor/sensory deficits  LABORATORY DATA:  I have reviewed the data as listed    Latest Ref Rng & Units 03/04/2024    9:39 AM 02/28/2024   10:18 AM 09/05/2011   10:11 AM  CBC  WBC 4.0 - 10.5 K/uL 48.3  61.7  4.9   Hemoglobin 13.0 - 17.0 g/dL 83.5   83.4  85.3   Hematocrit 39.0 - 52.0 % 50.0  51.1  45.2   Platelets 150 - 400 K/uL 167  161  177        Latest Ref Rng & Units 03/04/2024    9:39 AM 02/28/2024   10:18 AM 09/05/2011   10:11 AM  CMP  Glucose 70 - 99 mg/dL 95  877  85   BUN 8 - 23 mg/dL 11  12  14    Creatinine 0.61 - 1.24 mg/dL 9.21  9.01  9.19   Sodium 135 - 145 mmol/L 137  136  140   Potassium 3.5 - 5.1 mmol/L 4.3  5.0  4.2  Chloride 98 - 111 mmol/L 106  104  107   CO2 22 - 32 mmol/L 26  25  24    Calcium 8.9 - 10.3 mg/dL 9.5  9.0  8.8   Total Protein 6.5 - 8.1 g/dL 7.8  7.3  7.1   Total Bilirubin 0.0 - 1.2 mg/dL 0.4  0.5  0.2   Alkaline Phos 38 - 126 U/L 64  59  70   AST 15 - 41 U/L 13  32  37   ALT 0 - 44 U/L 10  16  51     RADIOGRAPHIC STUDIES: I have personally reviewed the radiological images as listed and agreed with the findings in the report. No results found.  ASSESSMENT & PLAN Ryan Carlson is a 63 y.o. male who presents to the clinic for evaluation of lymphocytosis concerning from chronic lymphocytic leukemia.   Lymphocytosis is an elevation in the lymphocyte cells in the blood. This may indicate several possible conditions including hematological malignancies, transient elevation from infection, or response to inflammation. Lymphocytes may also be elevated after splenectomy. The workup for lymphocytosis consists of determining if the lymphocytes are malignant in nature. The best test to determine this is flow cytometry, which can help determine if the lymphocytes are a monoclonal population.   #Lymphocytosis --labs to include CBC, CMP, LDH, uric acid and flow cytometry --due to strong suspicion of CLL, we will proceed with obtaining prognostic labs including IgVH hypermutation status, ZAP-70 and CLL prognostic panel.  --no evidence of lymphadenopathy on physical exam. Patient has no history of splenectomy but evidence of splenomegaly noted on abdominal US  from 08/2023.  --no B-symptoms identified --If  there is confirmation of CLL per workup today, tentative plan is observation as patient has Rai Stage II.  --RTC in 3 months with repeat labs.    Orders Placed This Encounter  Procedures   CBC with Differential (Cancer Center Only)    Standing Status:   Future    Number of Occurrences:   1    Expiration Date:   03/03/2025   CMP (Cancer Center only)    Standing Status:   Future    Number of Occurrences:   1    Expiration Date:   03/03/2025   Lactate dehydrogenase (LDH)    Standing Status:   Future    Number of Occurrences:   1    Expiration Date:   03/03/2025   Flow Cytometry, Peripheral Blood (Oncology)    Standing Status:   Future    Number of Occurrences:   1    Expiration Date:   03/03/2025   FISH, CLL Prognostic Panel    Standing Status:   Future    Number of Occurrences:   1    Expiration Date:   03/03/2025   IgVH Somatic Hypermutation    Standing Status:   Future    Number of Occurrences:   1    Expiration Date:   03/03/2025   Save Smear for Provider Slide Review    Standing Status:   Future    Number of Occurrences:   1    Expiration Date:   03/04/2025   Uric acid    Standing Status:   Future    Number of Occurrences:   1    Expiration Date:   03/04/2025   Miscellaneous [facility] test (send-out)    Standing Status:   Future    Number of Occurrences:   1    Expiration Date:   03/04/2025  Test name / description::   Zap-70, test # [id number]    All questions were answered. The patient knows to call the clinic with any problems, questions or concerns.  I have spent a total of 60 minutes minutes of face-to-face and non-face-to-face time, preparing to see the patient, obtaining and/or reviewing separately obtained history, performing a medically appropriate examination, counseling and educating the patient, ordering medications/tests/procedures, referring and communicating with other health care professionals, documenting clinical information in the electronic health record,  independently interpreting results and communicating results to the patient, and care coordination.   Johnston Police, PA-C Department of Hematology/Oncology [facility] at [facility] Phone: [phone]  Patient was seen with Dr. Federico.   Shermon CHRISTELLA Hearing BD, Catovsky D, Caligaris-Cappio F, Dighiero G, Dhner H, Hillmen P, Keating M, Montserrat E, Chiorazzi N, Stilgenbauer S, Rai KR, [place], Eichhorst B, O'Brien S, Robak T, Seymour JF, Kipps TJ. iwCLL guidelines for diagnosis, indications for treatment, response assessment, and supportive management of CLL. Blood. 2018 Jun 21;131(25):2745-2760.  Active disease should be clearly documented to initiate therapy. At least 1 of the following criteria should be met.  1) Evidence of progressive marrow failure as manifested by the development of, or worsening of, anemia and/or thrombocytopenia. Cutoff levels of Hb <10 g/dL or platelet counts <899  109/L are generally regarded as indication for treatment. However, in some patients, platelet counts <100  109/L may remain stable over a long period; this situation does not automatically require therapeutic intervention. 2) Massive (ie, >=6 cm below the left costal margin) or progressive or symptomatic splenomegaly. 3) Massive nodes (ie, >=10 cm in longest diameter) or progressive or symptomatic lymphadenopathy. 4) Progressive lymphocytosis with an increase of >=50% over a 75-month period, or lymphocyte doubling time (LDT) <6 months. LDT can be obtained by linear regression extrapolation of absolute lymphocyte counts obtained at intervals of 2 weeks over an observation period of 2 to 3 months; patients with initial blood lymphocyte counts <30  109/L may require a longer observation period to determine the LDT. Factors contributing to lymphocytosis other than CLL (eg, infections, steroid administration) should be excluded. 5) Autoimmune complications including anemia or thrombocytopenia  poorly responsive to corticosteroids. 6) Symptomatic or functional extranodal involvement (eg, skin, kidney, lung, spine). Disease-related symptoms as defined by any of the following: Unintentional weight loss >=10% within the previous 6 months. Significant fatigue (ie, ECOG performance scale 2 or worse; cannot work or unable to perform usual activities). Fevers >=100.41F or 38.0C for 2 or more weeks without evidence of infection. Night sweats for >=1 month without evidence of infection.   I have read the above note and personally examined the patient. I agree with the assessment and plan as noted above.  Briefly Ryan Carlson is a 63 year old male who presents for evaluation of marked lymphocytosis.  He underwent routine blood work recently which revealed this increase in lymphocytes.  He was told to go to the emergency department at which time it is recommended he follow-up with us  in the outpatient setting.  He reports that he has had no recent infectious symptoms such as fevers, chills, sweats.  He has had no trouble with nausea, vomiting, or diarrhea.  Overall he feels well and is his baseline level of health.  His other blood counts including hemoglobin and platelets are within normal limits.  At this time findings do appear consistent with a CLL like condition.  Will order flow cytometry in order to evaluate this further.  In event the flow cytometry is inconclusive could recommend further studies to include CT chest abdomen pelvis.  If this were to be CLL patient does not require immediate treatment as his other blood counts are stable he does not currently meet criteria for treatment.  If he were to have a rapid progression of his lymphocytes we could consider initiation of therapy.  Briefly discussed with him treatment options including immunotherapy infusions such as Gazyva with venetoclax or medications like ibrutinib.  He voices understanding of our findings and plan moving forward.   Norleen IVAR Kidney, MD Department of Hematology/Oncology [facility] at [facility] Phone: [phone] Pager: [phone] Email: [email or web address]

## 2024-03-04 ENCOUNTER — Inpatient Hospital Stay

## 2024-03-04 ENCOUNTER — Encounter: Payer: Self-pay | Admitting: Physician Assistant

## 2024-03-04 ENCOUNTER — Inpatient Hospital Stay: Attending: Physician Assistant | Admitting: Physician Assistant

## 2024-03-04 VITALS — BP 130/74 | HR 76 | Temp 97.6°F | Resp 17 | Ht 67.0 in | Wt 280.8 lb

## 2024-03-04 DIAGNOSIS — F17210 Nicotine dependence, cigarettes, uncomplicated: Secondary | ICD-10-CM | POA: Diagnosis not present

## 2024-03-04 DIAGNOSIS — B182 Chronic viral hepatitis C: Secondary | ICD-10-CM | POA: Insufficient documentation

## 2024-03-04 DIAGNOSIS — D72820 Lymphocytosis (symptomatic): Secondary | ICD-10-CM | POA: Insufficient documentation

## 2024-03-04 LAB — CBC WITH DIFFERENTIAL (CANCER CENTER ONLY)
Abs Immature Granulocytes: 0.11 10*3/uL — ABNORMAL HIGH (ref 0.00–0.07)
Basophils Absolute: 0 10*3/uL (ref 0.0–0.1)
Basophils Relative: 0 %
Eosinophils Absolute: 0.2 10*3/uL (ref 0.0–0.5)
Eosinophils Relative: 1 %
HCT: 50 % (ref 39.0–52.0)
Hemoglobin: 16.4 g/dL (ref 13.0–17.0)
Immature Granulocytes: 0 %
Lymphocytes Relative: 86 %
Lymphs Abs: 41.6 10*3/uL — ABNORMAL HIGH (ref 0.7–4.0)
MCH: 27.2 pg (ref 26.0–34.0)
MCHC: 32.8 g/dL (ref 30.0–36.0)
MCV: 82.8 fL (ref 80.0–100.0)
Monocytes Absolute: 2.3 10*3/uL — ABNORMAL HIGH (ref 0.1–1.0)
Monocytes Relative: 5 %
Neutro Abs: 4.1 10*3/uL (ref 1.7–7.7)
Neutrophils Relative %: 8 %
Platelet Count: 167 10*3/uL (ref 150–400)
RBC: 6.04 MIL/uL — ABNORMAL HIGH (ref 4.22–5.81)
RDW: 19.4 % — ABNORMAL HIGH (ref 11.5–15.5)
Smear Review: NORMAL
WBC Count: 48.3 10*3/uL — ABNORMAL HIGH (ref 4.0–10.5)
nRBC: 0 % (ref 0.0–0.2)

## 2024-03-04 LAB — CMP (CANCER CENTER ONLY)
ALT: 10 U/L (ref 0–44)
AST: 13 U/L — ABNORMAL LOW (ref 15–41)
Albumin: 4.1 g/dL (ref 3.5–5.0)
Alkaline Phosphatase: 64 U/L (ref 38–126)
Anion gap: 5 (ref 5–15)
BUN: 11 mg/dL (ref 8–23)
CO2: 26 mmol/L (ref 22–32)
Calcium: 9.5 mg/dL (ref 8.9–10.3)
Chloride: 106 mmol/L (ref 98–111)
Creatinine: 0.78 mg/dL (ref 0.61–1.24)
GFR, Estimated: >60 mL/min (ref >60)
Glucose, Bld: 95 mg/dL (ref 70–99)
Potassium: 4.3 mmol/L (ref 3.5–5.1)
Sodium: 137 mmol/L (ref 135–145)
Total Bilirubin: 0.4 mg/dL (ref 0.0–1.2)
Total Protein: 7.8 g/dL (ref 6.5–8.1)

## 2024-03-04 LAB — URIC ACID: Uric Acid, Serum: 6.7 mg/dL (ref 3.7–8.6)

## 2024-03-04 LAB — LACTATE DEHYDROGENASE: LDH: 148 U/L (ref 98–192)

## 2024-03-04 LAB — SAVE SMEAR(SSMR), FOR PROVIDER SLIDE REVIEW

## 2024-03-05 ENCOUNTER — Telehealth: Payer: Self-pay | Admitting: Physician Assistant

## 2024-03-05 NOTE — Telephone Encounter (Signed)
Scheduled appointments with the patient

## 2024-03-12 LAB — SURGICAL PATHOLOGY

## 2024-03-13 ENCOUNTER — Telehealth: Payer: Self-pay | Admitting: Physician Assistant

## 2024-03-13 LAB — FLOW CYTOMETRY

## 2024-03-13 NOTE — Telephone Encounter (Signed)
 I called and spoke to Mr. Ryan Carlson to review lab results from 03/04/2024. 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 Patient does not meet criteria to initiate treatment so he will proceed with observation at this time. He will return in 3 months for a follow up visit with repeat labs. Mr. Flinn expressed understanding of the plan provided.

## 2024-03-14 LAB — IGVH SOMATIC HYPERMUTATION

## 2024-03-20 LAB — MISC LABCORP TEST (SEND OUT): Labcorp test code: 252489

## 2024-05-27 ENCOUNTER — Telehealth: Payer: Self-pay | Admitting: Hematology and Oncology

## 2024-06-04 ENCOUNTER — Ambulatory Visit: Admitting: Physician Assistant

## 2024-06-04 ENCOUNTER — Other Ambulatory Visit

## 2024-06-19 ENCOUNTER — Inpatient Hospital Stay: Attending: Physician Assistant

## 2024-06-19 ENCOUNTER — Other Ambulatory Visit: Payer: Self-pay | Admitting: Hematology and Oncology

## 2024-06-19 ENCOUNTER — Inpatient Hospital Stay: Admitting: Hematology and Oncology

## 2024-06-19 VITALS — BP 132/82 | HR 88 | Temp 97.4°F | Resp 15 | Wt 277.4 lb

## 2024-06-19 DIAGNOSIS — C9110 Chronic lymphocytic leukemia of B-cell type not having achieved remission: Secondary | ICD-10-CM

## 2024-06-19 DIAGNOSIS — Z79899 Other long term (current) drug therapy: Secondary | ICD-10-CM | POA: Diagnosis not present

## 2024-06-19 DIAGNOSIS — D72820 Lymphocytosis (symptomatic): Secondary | ICD-10-CM

## 2024-06-19 DIAGNOSIS — F17210 Nicotine dependence, cigarettes, uncomplicated: Secondary | ICD-10-CM | POA: Diagnosis not present

## 2024-06-19 LAB — CBC WITH DIFFERENTIAL (CANCER CENTER ONLY)
Abs Immature Granulocytes: 0.24 K/uL — ABNORMAL HIGH (ref 0.00–0.07)
Basophils Absolute: 0 K/uL (ref 0.0–0.1)
Basophils Relative: 0 %
Eosinophils Absolute: 0.2 K/uL (ref 0.0–0.5)
Eosinophils Relative: 0 %
HCT: 51.3 % (ref 39.0–52.0)
Hemoglobin: 16.6 g/dL (ref 13.0–17.0)
Immature Granulocytes: 0 %
Lymphocytes Relative: 91 %
Lymphs Abs: 68.1 K/uL — ABNORMAL HIGH (ref 0.7–4.0)
MCH: 27.1 pg (ref 26.0–34.0)
MCHC: 32.4 g/dL (ref 30.0–36.0)
MCV: 83.8 fL (ref 80.0–100.0)
Monocytes Absolute: 2.1 K/uL — ABNORMAL HIGH (ref 0.1–1.0)
Monocytes Relative: 3 %
Neutro Abs: 4.5 K/uL (ref 1.7–7.7)
Neutrophils Relative %: 6 %
Platelet Count: 160 K/uL (ref 150–400)
RBC: 6.12 MIL/uL — ABNORMAL HIGH (ref 4.22–5.81)
RDW: 19 % — ABNORMAL HIGH (ref 11.5–15.5)
Smear Review: NORMAL
WBC Count: 75.1 K/uL (ref 4.0–10.5)
nRBC: 0 % (ref 0.0–0.2)

## 2024-06-19 LAB — CMP (CANCER CENTER ONLY)
ALT: 16 U/L (ref 0–44)
AST: 22 U/L (ref 15–41)
Albumin: 4.1 g/dL (ref 3.5–5.0)
Alkaline Phosphatase: 76 U/L (ref 38–126)
Anion gap: 3 — ABNORMAL LOW (ref 5–15)
BUN: 13 mg/dL (ref 8–23)
CO2: 29 mmol/L (ref 22–32)
Calcium: 10.5 mg/dL — ABNORMAL HIGH (ref 8.9–10.3)
Chloride: 105 mmol/L (ref 98–111)
Creatinine: 0.94 mg/dL (ref 0.61–1.24)
GFR, Estimated: >60 mL/min (ref >60)
Glucose, Bld: 88 mg/dL (ref 70–99)
Potassium: 4.5 mmol/L (ref 3.5–5.1)
Sodium: 137 mmol/L (ref 135–145)
Total Bilirubin: 0.4 mg/dL (ref 0.0–1.2)
Total Protein: 7.8 g/dL (ref 6.5–8.1)

## 2024-06-19 LAB — LACTATE DEHYDROGENASE: LDH: 152 U/L (ref 98–192)

## 2024-06-19 NOTE — Progress Notes (Signed)
 [facility] [phone]   [phone]  PROGRESS NOTE  Patient Care Team: Jhon Elveria LABOR, MD as PCP - General (Family Medicine)  Hematological/Oncological History # CLL Rai Stage 0   Interval History:  Ryan Carlson 63 y.o. male with medical history significant for newly diagnosed CLL who presents for a follow up visit. The patient's last visit was on 03/04/2024 at which time he establish care. In the interim since the last visit he has had no major changes in his health.  On exam today Ryan Carlson reports he has been about the same in the interim since our last visit.  He reports that he does have some occasional aches and pains but nothing major.  He reports he does not have any issues with runny nose, sore throat, cough and denies any fevers, chills, sweats.  He reports his appetite remains great and he is eating well.  His weight is stable.  He denies any bumps or lumps concerning for lymphadenopathy.  He reports overall his energy is good but he does have some occasional bad days.  He notes he remains quite active.  He is had no trouble with bleeding, bruising, or dark stools.  He reports he is interested in getting his flu shot.  He notes nothing else out of the ordinary.  A full 10 point ROS is otherwise negative.  MEDICAL HISTORY:  Past Medical History:  Diagnosis Date   Hepatitis C     SURGICAL HISTORY: Past Surgical History:  Procedure Laterality Date   FEMORAL TRACTION PIN     HERNIA REPAIR      SOCIAL HISTORY: Social History   Socioeconomic History   Marital status: Married    Spouse name: Not on file   Number of children: Not on file   Years of education: Not on file   Highest education level: Not on file  Occupational History   Not on file  Tobacco Use   Smoking status: Every Day    Current packs/day: 1.00    Types: Cigarettes   Smokeless tobacco: Not on file  Substance and Sexual Activity   Alcohol use: Yes     Alcohol/week: 3.0 standard drinks of alcohol    Types: 3 Cans of beer per week   Drug use: Never   Sexual activity: Not on file  Other Topics Concern   Not on file  Social History Narrative   Not on file   Social Drivers of Health   Financial Resource Strain: Not on file  Food Insecurity: Low Risk  (03/06/2024)   Received from [facility]   Hunger Vital Sign    Within the past 12 months, you worried that your food would run out before you got money to buy more: Never true    Within the past 12 months, the food you bought just didn't last and you didn't have money to get more. : Never true  Recent Concern: Food Insecurity - Food Insecurity Present (03/04/2024)   Hunger Vital Sign    Worried About Running Out of Food in the Last Year: Sometimes true    Ran Out of Food in the Last Year: Sometimes true  Transportation Needs: No Transportation Needs (03/06/2024)   Received from [facility]    In the past 12 months, has lack of reliable transportation kept you from medical appointments, meetings, work or from getting things needed for daily living? : No  Physical Activity: Not on file  Stress: Not  on file  Social Connections: Not on file  Intimate Partner Violence: Not At Risk (03/04/2024)   Humiliation, Afraid, Rape, and Kick questionnaire    Fear of Current or Ex-Partner: No    Emotionally Abused: No    Physically Abused: No    Sexually Abused: No    FAMILY HISTORY: No family history on file.  ALLERGIES:  has no known allergies.  MEDICATIONS:  Current Outpatient Medications  Medication Sig Dispense Refill   losartan (COZAAR) 25 MG tablet Take 25 mg by mouth daily.     No current facility-administered medications for this visit.    REVIEW OF SYSTEMS:   Constitutional: ( - ) fevers, ( - )  chills , ( - ) night sweats Eyes: ( - ) blurriness of vision, ( - ) double vision, ( - ) watery eyes Ears, nose, mouth, throat, and face: ( - ) mucositis, ( - ) sore  throat Respiratory: ( - ) cough, ( - ) dyspnea, ( - ) wheezes Cardiovascular: ( - ) palpitation, ( - ) chest discomfort, ( - ) lower extremity swelling Gastrointestinal:  ( - ) nausea, ( - ) heartburn, ( - ) change in bowel habits Skin: ( - ) abnormal skin rashes Lymphatics: ( - ) new lymphadenopathy, ( - ) easy bruising Neurological: ( - ) numbness, ( - ) tingling, ( - ) new weaknesses Behavioral/Psych: ( - ) mood change, ( - ) new changes  All other systems were reviewed with the patient and are negative.  PHYSICAL EXAMINATION: Vitals:   06/19/24 1447  BP: 132/82  Pulse: 88  Resp: 15  Temp: (!) 97.4 F (36.3 C)  SpO2: 91%   Filed Weights   06/19/24 1447  Weight: 277 lb 6.4 oz (125.8 kg)    GENERAL: Well-appearing middle-age male, alert, no distress and comfortable SKIN: skin color, texture, turgor are normal, no rashes or significant lesions EYES: conjunctiva are pink and non-injected, sclera clear LUNGS: clear to auscultation and percussion with normal breathing effort HEART: regular rate & rhythm and no murmurs and no lower extremity edema Musculoskeletal: no cyanosis of digits and no clubbing  PSYCH: alert & oriented x 3, fluent speech NEURO: no focal motor/sensory deficits  LABORATORY DATA:  I have reviewed the data as listed    Latest Ref Rng & Units 06/19/2024    2:20 PM 03/04/2024    9:39 AM 02/28/2024   10:18 AM  CBC  WBC 4.0 - 10.5 K/uL 75.1  48.3  61.7   Hemoglobin 13.0 - 17.0 g/dL 83.3  83.5  83.4   Hematocrit 39.0 - 52.0 % 51.3  50.0  51.1   Platelets 150 - 400 K/uL 160  167  161        Latest Ref Rng & Units 06/19/2024    2:20 PM 03/04/2024    9:39 AM 02/28/2024   10:18 AM  CMP  Glucose 70 - 99 mg/dL 88  95  877   BUN 8 - 23 mg/dL 13  11  12    Creatinine 0.61 - 1.24 mg/dL 9.05  9.21  9.01   Sodium 135 - 145 mmol/L 137  137  136   Potassium 3.5 - 5.1 mmol/L 4.5  4.3  5.0   Chloride 98 - 111 mmol/L 105  106  104   CO2 22 - 32 mmol/L 29  26  25     Calcium 8.9 - 10.3 mg/dL 89.4  9.5  9.0   Total Protein 6.5 - 8.1  g/dL 7.8  7.8  7.3   Total Bilirubin 0.0 - 1.2 mg/dL 0.4  0.4  0.5   Alkaline Phos 38 - 126 U/L 76  64  59   AST 15 - 41 U/L 22  13  32   ALT 0 - 44 U/L 16  10  16      RADIOGRAPHIC STUDIES: No results found.  ASSESSMENT & PLAN Ryan Carlson 63 y.o. male with medical history significant for newly diagnosed CLL who presents for a follow up visit.   # CLL Rai Stage 0 -- Labs today show 75.1, hemoglobin 16.6, MCV 83.8, platelets 160.  Creatinine LFTs within normal limits. -- Does not appear that the FISH prognostic panel was drawn, will assure this is collected at next visit. -- No evidence of lymphadenopathy, splenomegaly, or cytopenias. -- No clear indication to start treatment based on today's assessment. -- Recommend repeat labs in 3 months time with clinic visit in 6 months.  No orders of the defined types were placed in this encounter.   All questions were answered. The patient knows to call the clinic with any problems, questions or concerns.  A total of more than 30 minutes were spent on this encounter with face-to-face time and non-face-to-face time, including preparing to see the patient, ordering tests and/or medications, counseling the patient and coordination of care as outlined above.   Norleen IVAR Kidney, MD Department of Hematology/Oncology [facility] Phone: [phone] Pager: [phone] Email: [email or web address]  06/29/2024 7:17 PM  Shermon CHRISTELLA Hearing BD, Catovsky D, Caligaris-Cappio F, Dighiero G, Dhner H, Hillmen P, Keating M, Montserrat E, Chiorazzi N, Stilgenbauer S, Rai KR, Raiford, Eichhorst B, O'Brien S, Robak T, Seymour JF, Kipps TJ. iwCLL guidelines for diagnosis, indications for treatment, response assessment, and supportive management of CLL. Blood. 2018 Jun 21;131(25):2745-2760.  Active disease should be clearly documented to initiate  therapy. At least 1 of the following criteria should be met.  1) Evidence of progressive marrow failure as manifested by the development of, or worsening of, anemia and/or thrombocytopenia. Cutoff levels of Hb <10 g/dL or platelet counts <899  109/L are generally regarded as indication for treatment. However, in some patients, platelet counts <100  109/L may remain stable over a long period; this situation does not automatically require therapeutic intervention. 2) Massive (ie, >=6 cm below the left costal margin) or progressive or symptomatic splenomegaly. 3) Massive nodes (ie, >=10 cm in longest diameter) or progressive or symptomatic lymphadenopathy. 4) Progressive lymphocytosis with an increase of >=50% over a 17-month period, or lymphocyte doubling time (LDT) <6 months. LDT can be obtained by linear regression extrapolation of absolute lymphocyte counts obtained at intervals of 2 weeks over an observation period of 2 to 3 months; patients with initial blood lymphocyte counts <30  109/L may require a longer observation period to determine the LDT. Factors contributing to lymphocytosis other than CLL (eg, infections, steroid administration) should be excluded. 5) Autoimmune complications including anemia or thrombocytopenia poorly responsive to corticosteroids. 6) Symptomatic or functional extranodal involvement (eg, skin, kidney, lung, spine). Disease-related symptoms as defined by any of the following: Unintentional weight loss >=10% within the previous 6 months. Significant fatigue (ie, ECOG performance scale 2 or worse; cannot work or unable to perform usual activities). Fevers >=100.80F or 38.0C for 2 or more weeks without evidence of infection. Night sweats for >=1 month without evidence of infection.
